# Patient Record
Sex: Male | Born: 1970 | Race: White | Hispanic: No | Marital: Married | Smoking: Never smoker
Health system: Southern US, Community
[De-identification: ages and names within clinical notes are randomized; demographics above are authoritative.]

---

## 1996-07-19 HISTORY — PX: APPENDECTOMY: SHX54

## 2000-04-24 ENCOUNTER — Emergency Department (HOSPITAL_COMMUNITY): Admission: EM | Admit: 2000-04-24 | Discharge: 2000-04-24 | Payer: Self-pay | Admitting: Emergency Medicine

## 2003-12-27 ENCOUNTER — Inpatient Hospital Stay (HOSPITAL_COMMUNITY): Admission: EM | Admit: 2003-12-27 | Discharge: 2003-12-28 | Payer: Self-pay | Admitting: Emergency Medicine

## 2003-12-27 ENCOUNTER — Encounter (INDEPENDENT_AMBULATORY_CARE_PROVIDER_SITE_OTHER): Payer: Self-pay | Admitting: Specialist

## 2009-05-25 ENCOUNTER — Emergency Department (HOSPITAL_BASED_OUTPATIENT_CLINIC_OR_DEPARTMENT_OTHER): Admission: EM | Admit: 2009-05-25 | Discharge: 2009-05-25 | Payer: Self-pay | Admitting: Emergency Medicine

## 2010-12-04 NOTE — Op Note (Signed)
NAMERASHAWD, LASKARIS                         ACCOUNT NO.:  [id number]   MEDICAL RECORD NO.:  [id number]                   PATIENT TYPE:  INP   LOCATION:  0103                                 FACILITY:  [facility]   PHYSICIAN:  Anselm Pancoast. Zachery Dakins, M.D.          DATE OF BIRTH:  [birth year]   DATE OF PROCEDURE:  DATE OF DISCHARGE:                                 OPERATIVE REPORT   PREOPERATIVE DIAGNOSIS:  Acute appendicitis.   POSTOPERATIVE DIAGNOSIS:  Acute appendicitis.   PROCEDURE:  Laparoscopic appendectomy.   ANESTHESIA:  General.   SURGEON:  Anselm Pancoast. Zachery Dakins, M.D.   HISTORY:  Jaime White is a 40 year old male who has had an approximately  36-48 hour history of kind of vague, abdominal, gaseous epigastric pain that  shifted to the lower abdomen this morning.  He was seen in urgent care by  Dr. Willa Frater and found to be definitely tender in the right lower quadrant  and clinically was thought to have acute appendicitis.  The white count was  about 14,000.  He called.  I suggested I see him in the emergency room, and  I was in agreement with the diagnosis.  His urinalysis has been negative,  and I did not think any other tests were necessary.  Patient is quite thin,  and I discussed with him about doing an open or a laparoscopic appendectomy.  He elected for the laparoscopic.   Patient was taken to the operating suite.  He was given 2 gm of Cefotetan,  being allergic to penicillin.  Abdomen was prepped with Betadine surgical  scrub and solution.  A Foley catheter had been inserted sterilely.  The  patient has a little fascial defect right at the umbilicus, and I made an  incision right through the skin within the umbilicus so I could put a few  stitches in this low umbilical defect and close him.  The skin was opened,  and we opened right into that little hernia sac weakness and then placed a  traction suture of 0 Vicryl and the Hasson cannula introduced.  Next, the  camera was inserted.  Carbon dioxide had been infused.  You could see a  markedly inflamed appendix laying, not truly retrocecal but kind of under  the cecum.  The proximal first inch of the appendix was normal.  The rest  was markedly inflamed.  I placed the upper 5 mm trocar and then a 10-11 in  the left lower quadrant.  Then with the camera in the left lower quadrant,  dissected through the umbilical end of the 5 mm port.  I elected to use a  vascular GIA to transect the appendix at the junction with the cecum first,  since this was the only portion of the appendix that was not markedly  inflamed, and I did not want to rupture it by manipulating it.  With the  appendix divided at this point, there was good  hemostasis.  I then placed a  __________ retractor on the base on the appendix and kind of elevated it and  then used the Hasson vascular and divided the mesentery between little  pedicles with hemostasis being obtained.  The appendix was then placed in  the EndoCatch bag.  There was a little inflammation of the periappendiceal  fatty tissue, but I did not remove that.  The little wounds were irrigated  with good hemostasis, and then I switched the camera to the left lower  quadrant and brought out the appendix within the bag at the umbilicus.  Next, I elected to close the fascia at the umbilicus first, using 0  Surgilene, and three stitches were placed and closed it vertically to bring  the low fascia together.  Next, the camera was reinserted.  Looking in,  there was good closure.  No loops of bowel or anything in the area.  Next, a  port in the left lower quadrant was removed, and I did place an 0 Vicryl in  the oblique fascia for closure since he is so thin.  Next, a 5 mm port as  withdrawn.  The carbon dioxide has been released.  The patient's  subcutaneous wounds were closed with 4-0 Vicryl, and then  Benzoin and Steri-Strips on the three little incisions.  The patient  tolerated  the procedure nicely.  Hopefully, he will be ready for discharge  in the morning.  A Foley catheter was removed before awakening the patient.  The patient will go to the recovery room and should be able to tolerate a  diet later on this evening.                                               Anselm Pancoast. Zachery Dakins, M.D.    WJW/MEDQ  D:  12/27/2003  T:  12/27/2003  Job:  [id number]   cc:   [facility]

## 2012-07-03 ENCOUNTER — Emergency Department (HOSPITAL_BASED_OUTPATIENT_CLINIC_OR_DEPARTMENT_OTHER): Payer: Managed Care, Other (non HMO)

## 2012-07-03 ENCOUNTER — Emergency Department (HOSPITAL_BASED_OUTPATIENT_CLINIC_OR_DEPARTMENT_OTHER)
Admission: EM | Admit: 2012-07-03 | Discharge: 2012-07-03 | Disposition: A | Discharge status: Home / Self Care | Payer: Managed Care, Other (non HMO) | Attending: Emergency Medicine | Admitting: Emergency Medicine

## 2012-07-03 ENCOUNTER — Encounter (HOSPITAL_BASED_OUTPATIENT_CLINIC_OR_DEPARTMENT_OTHER): Payer: Self-pay

## 2012-07-03 DIAGNOSIS — R61 Generalized hyperhidrosis: Secondary | ICD-10-CM | POA: Insufficient documentation

## 2012-07-03 DIAGNOSIS — R42 Dizziness and giddiness: Secondary | ICD-10-CM | POA: Insufficient documentation

## 2012-07-03 DIAGNOSIS — R197 Diarrhea, unspecified: Secondary | ICD-10-CM | POA: Insufficient documentation

## 2012-07-03 DIAGNOSIS — R55 Syncope and collapse: Secondary | ICD-10-CM

## 2012-07-03 DIAGNOSIS — R404 Transient alteration of awareness: Secondary | ICD-10-CM | POA: Insufficient documentation

## 2012-07-03 DIAGNOSIS — R112 Nausea with vomiting, unspecified: Secondary | ICD-10-CM | POA: Insufficient documentation

## 2012-07-03 LAB — CBC WITH DIFFERENTIAL/PLATELET
Basophils Absolute: 0 10*3/uL (ref 0.0–0.1)
Eosinophils Absolute: 0.2 10*3/uL (ref 0.0–0.7)
Eosinophils Relative: 1 % (ref 0–5)
Lymphocytes Relative: 26 % (ref 12–46)
Monocytes Absolute: 1.5 10*3/uL — ABNORMAL HIGH (ref 0.1–1.0)
Monocytes Relative: 9 % (ref 3–12)
Neutro Abs: 10.8 10*3/uL — ABNORMAL HIGH (ref 1.7–7.7)
Neutrophils Relative %: 64 % (ref 43–77)
Platelets: 163 10*3/uL (ref 150–400)
RBC: 4.86 MIL/uL (ref 4.22–5.81)

## 2012-07-03 LAB — COMPREHENSIVE METABOLIC PANEL
ALT: 27 U/L (ref 0–53)
GFR calc Af Amer: >90 mL/min (ref >90)
GFR calc non Af Amer: >90 mL/min (ref >90)
Potassium: 3.7 mEq/L (ref 3.5–5.1)
Sodium: 142 mEq/L (ref 135–145)

## 2012-07-03 LAB — URINALYSIS, ROUTINE W REFLEX MICROSCOPIC
Bilirubin Urine: NEGATIVE
Ketones, ur: NEGATIVE mg/dL
Leukocytes, UA: NEGATIVE
Specific Gravity, Urine: 1.019 (ref 1.005–1.030)
pH: 5 (ref 5.0–8.0)

## 2012-07-03 MED ORDER — ONDANSETRON 8 MG PO TBDP: ORAL_TABLET | ORAL | Status: AC

## 2012-07-03 MED ORDER — SODIUM CHLORIDE 0.9 % IV BOLUS (SEPSIS)
1000.0000 mL | Freq: Once | INTRAVENOUS | Status: AC
  Administered 2012-07-03: 1000 mL via INTRAVENOUS

## 2012-07-03 MED ORDER — IOHEXOL 300 MG/ML  SOLN
100.0000 mL | Freq: Once | INTRAMUSCULAR | Status: AC | PRN
  Administered 2012-07-03: 100 mL via INTRAVENOUS

## 2012-07-03 MED ORDER — ONDANSETRON HCL 4 MG/2ML IJ SOLN
INTRAMUSCULAR | Status: AC
  Filled 2012-07-03: qty 2

## 2012-07-03 MED ORDER — SODIUM CHLORIDE 0.9 % IV SOLN: Freq: Once | INTRAVENOUS | Status: DC

## 2012-07-03 MED ORDER — ONDANSETRON HCL 4 MG/2ML IJ SOLN
4.0000 mg | Freq: Once | INTRAMUSCULAR | Status: AC
  Administered 2012-07-03: 4 mg via INTRAVENOUS

## 2012-07-03 NOTE — ED Notes (Signed)
MD back at bedside. Patient reports that he is feeling better. New order for CT abdomen due to spouse reporting that patient was hit in abdomen with soccer ball yesterday during match. No bruising or obvious tenderness noted

## 2012-07-03 NOTE — ED Notes (Signed)
Patient transported to CT 

## 2012-07-03 NOTE — ED Notes (Signed)
I took cbg got reading of 95 mg/dcltr. I also got ecg and gave orig. Copy to Dr. Nicanor Alcon.

## 2012-07-03 NOTE — ED Notes (Signed)
Returned from CT.

## 2012-07-03 NOTE — ED Notes (Signed)
Patient reports that he became nauseated this am and had near syncopal episode. Questionable loc.  Reports dizziness and diaphoresis pta

## 2012-07-03 NOTE — ED Provider Notes (Signed)
History     CSN: [id number]  Arrival date & time 07/03/12  0316   First MD Initiated Contact with Patient 07/03/12 0330      Chief Complaint  Patient presents with  . nausea, not feeling well, diaphoretic     (Consider location/radiation/quality/duration/timing/severity/associated sxs/prior treatment) Patient is a 41 y.o. male presenting with syncope. The history is provided by the patient and the spouse.  Loss of Consciousness This is a new problem. The current episode started less than 1 hour ago. The problem occurs constantly. The problem has been resolved. Pertinent negatives include no chest pain, no abdominal pain, no headaches and no shortness of breath. Nothing aggravates the symptoms. Nothing relieves the symptoms. He has tried nothing for the symptoms. The treatment provided significant relief.    History reviewed. No pertinent past medical history.  History reviewed. No pertinent past surgical history.  No family history on file.  History  Substance Use Topics  . Smoking status: Never Smoker   . Smokeless tobacco: Not on file  . Alcohol Use:       Review of Systems  Constitutional: Negative for fever.  Respiratory: Negative for chest tightness and shortness of breath.   Cardiovascular: Positive for syncope. Negative for chest pain.  Gastrointestinal: Positive for vomiting. Negative for abdominal pain.  Neurological: Positive for light-headedness. Negative for syncope, facial asymmetry, speech difficulty, weakness, numbness and headaches.  All other systems reviewed and are negative.    Allergies  Penicillins  Home Medications  No current outpatient prescriptions on file.  BP 106/67  Pulse 73  Temp 97.7 F (36.5 C) (Oral)  Resp 16  SpO2 100%  Physical Exam  Constitutional: He is oriented to person, place, and time. He appears well-developed and well-nourished. No distress.  HENT:  Head: Normocephalic and atraumatic.  Right Ear: No mastoid  tenderness. Tympanic membrane is not injected. No hemotympanum.  Left Ear: No mastoid tenderness. Tympanic membrane is not injected. No hemotympanum.  Eyes: Conjunctivae normal and EOM are normal. Pupils are equal, round, and reactive to light.  Neck: Normal range of motion. Neck supple.  Cardiovascular: Normal rate, regular rhythm and intact distal pulses.   Pulmonary/Chest: Effort normal and breath sounds normal. He has no wheezes. He has no rales.  Abdominal: Soft. Bowel sounds are normal. There is no tenderness. There is no rebound and no guarding.  Musculoskeletal: Normal range of motion. He exhibits no edema.  Neurological: He is alert and oriented to person, place, and time. He has normal reflexes. No cranial nerve deficit.  Skin: Skin is warm and dry. No rash noted.  Psychiatric: He has a normal mood and affect.    ED Course  Procedures (including critical care time)  Labs Reviewed  CBC WITH DIFFERENTIAL - Abnormal; Notable for the following:    WBC 16.8 (*)     MCHC 36.1 (*)     Neutro Abs 10.8 (*)     Lymphs Abs 4.4 (*)     Monocytes Absolute 1.5 (*)     All other components within normal limits  COMPREHENSIVE METABOLIC PANEL - Abnormal; Notable for the following:    Glucose, Bld 105 (*)     AST 41 (*)     All other components within normal limits  GLUCOSE, CAPILLARY  TROPONIN I  URINALYSIS, ROUTINE W REFLEX MICROSCOPIC   Dg Chest Portable 1 View  07/03/2012  *RADIOLOGY REPORT*  Clinical Data: Near-syncopal episode  PORTABLE CHEST - 1 VIEW  Comparison: 12/27/2003  Findings:  Lungs are clear. No pleural effusion or pneumothorax. The cardiomediastinal contours are within normal limits. The visualized bones and soft tissues are without significant appreciable abnormality.  IMPRESSION: No radiographic evidence of acute cardiopulmonary process.   Original Report Authenticated By: Jearld Lesch, M.D.      No diagnosis found.    MDM   Date: 07/03/2012  Rate: 76   Rhythm: normal sinus rhythm  QRS Axis: normal  Intervals: normal  ST/T Wave abnormalities: normal  Conduction Disutrbances: none  Narrative Interpretation: unremarkable     Case d/w Dr. Felipa Eth and patient will be called with close follow up  Shortly thereafter wife reports child in the home began vomiting, symptoms likely to GI illness.        Jasmine Awe, MD 07/03/12 [phone]

## 2012-07-03 NOTE — ED Notes (Signed)
Sprite given as PO challenge, reports nausea is relieved

## 2012-07-03 NOTE — ED Notes (Signed)
I drew blood (full rainbow), including lactic acid on ice, labeled and took to lab, I then completed orthostatic vitals and notified Doctor Palumbo of results.

## 2012-07-03 NOTE — ED Notes (Signed)
MD at bedside. 

## 2012-07-03 NOTE — ED Notes (Signed)
Family at bedside. Patient reports that he is feeling better. IV NS infusing, awaiting lab and CT results.

## 2017-02-22 ENCOUNTER — Other Ambulatory Visit: Payer: Self-pay | Admitting: General Surgery

## 2017-09-11 ENCOUNTER — Encounter (HOSPITAL_BASED_OUTPATIENT_CLINIC_OR_DEPARTMENT_OTHER): Payer: Self-pay | Admitting: *Deleted

## 2017-09-11 ENCOUNTER — Emergency Department (HOSPITAL_BASED_OUTPATIENT_CLINIC_OR_DEPARTMENT_OTHER): Payer: 59

## 2017-09-11 ENCOUNTER — Other Ambulatory Visit: Payer: Self-pay

## 2017-09-11 DIAGNOSIS — Z79899 Other long term (current) drug therapy: Secondary | ICD-10-CM | POA: Insufficient documentation

## 2017-09-11 DIAGNOSIS — Y9366 Activity, soccer: Secondary | ICD-10-CM | POA: Insufficient documentation

## 2017-09-11 DIAGNOSIS — Y929 Unspecified place or not applicable: Secondary | ICD-10-CM | POA: Insufficient documentation

## 2017-09-11 DIAGNOSIS — W010XXA Fall on same level from slipping, tripping and stumbling without subsequent striking against object, initial encounter: Secondary | ICD-10-CM | POA: Insufficient documentation

## 2017-09-11 DIAGNOSIS — Y999 Unspecified external cause status: Secondary | ICD-10-CM | POA: Diagnosis not present

## 2017-09-11 DIAGNOSIS — S52552A Other extraarticular fracture of lower end of left radius, initial encounter for closed fracture: Secondary | ICD-10-CM | POA: Diagnosis not present

## 2017-09-11 DIAGNOSIS — S59912A Unspecified injury of left forearm, initial encounter: Secondary | ICD-10-CM | POA: Diagnosis present

## 2017-09-11 NOTE — ED Triage Notes (Signed)
Pt reports playing soccer and fell on left arm. Swelling noted. Pulse present. Able to wiggle digits

## 2017-09-11 NOTE — ED Triage Notes (Signed)
Called x1 for triage, no answer.

## 2017-09-11 NOTE — ED Notes (Signed)
Registration states pt has returned to lobby. He was at vending machine

## 2017-09-11 NOTE — ED Notes (Signed)
Second call no answer

## 2017-09-12 ENCOUNTER — Emergency Department (HOSPITAL_BASED_OUTPATIENT_CLINIC_OR_DEPARTMENT_OTHER)
Admission: EM | Admit: 2017-09-12 | Discharge: 2017-09-12 | Disposition: A | Discharge status: Home / Self Care | Payer: 59 | Attending: Emergency Medicine | Admitting: Emergency Medicine

## 2017-09-12 DIAGNOSIS — S52552A Other extraarticular fracture of lower end of left radius, initial encounter for closed fracture: Secondary | ICD-10-CM

## 2017-09-12 MED ORDER — HYDROCODONE-ACETAMINOPHEN 5-325 MG PO TABS: 1.0000 | ORAL_TABLET | Freq: Four times a day (QID) | ORAL | 0 refills | Status: DC | PRN

## 2017-09-12 MED ORDER — OXYCODONE-ACETAMINOPHEN 5-325 MG PO TABS: 1.0000 | ORAL_TABLET | Freq: Once | ORAL | Status: DC

## 2017-09-12 NOTE — ED Notes (Signed)
Pt does not want to take percocet at this time. EMT at bedside to place splint.

## 2017-09-12 NOTE — ED Notes (Signed)
PMS intact before and after. Pt tolerated well. All questions answered. 

## 2017-09-12 NOTE — ED Provider Notes (Signed)
[facility] EMERGENCY DEPARTMENT Provider Note   CSN: [id number] Arrival date & time: 09/11/17  2145     History   Chief Complaint Chief Complaint  Patient presents with  . Arm Injury    HPI Jaime White is a 47 y.o. male.  HPI  This a 47 year old male who presents with left wrist injury.  Patient reports that he fell playing soccer.  He fell out on outstretched hand.  He is right-handed.  Patient reports pain only with mobilization of the wrist.  Without mobilization his pain is 0.  He has not taken anything for his symptoms.  Denies any other injury.  Denies numbness or tingling in the hand.  History reviewed. No pertinent past medical history.  There are no active problems to display for this patient.   History reviewed. No pertinent surgical history.     Home Medications    Prior to Admission medications   Medication Sig Start Date End Date Taking? Authorizing Provider  HYDROcodone-acetaminophen (NORCO/VICODIN) 5-325 MG tablet Take 1 tablet by mouth every 6 (six) hours as needed. 09/12/17   Shon Baton, MD  ondansetron (ZOFRAN ODT) 8 MG disintegrating tablet 8mg  ODT q8 hours prn nausea 07/03/12   Palumbo, April, MD    Family History No family history on file.  Social History Social History   Tobacco Use  . Smoking status: Never Smoker  . Smokeless tobacco: Never Used  Substance Use Topics  . Alcohol use: Yes  . Drug use: No     Allergies   Penicillins   Review of Systems Review of Systems  Musculoskeletal:       Left wrist pain  Skin: Negative for wound.  Neurological: Negative for weakness and numbness.  All other systems reviewed and are negative.    Physical Exam Updated Vital Signs BP 106/75 (BP Location: Left Arm)   Pulse 63   Temp 98.2 F (36.8 C) (Oral)   Resp 16   Ht 5\' 10"  (1.778 m)   Wt 74.8 kg (165 lb)   SpO2 100%   BMI 23.68 kg/m   Physical Exam  Constitutional: He is oriented to person, place, and  time. He appears well-developed and well-nourished. No distress.  HENT:  Head: Normocephalic and atraumatic.  Cardiovascular: Normal rate and regular rhythm.  Pulmonary/Chest: Effort normal. No respiratory distress.  Musculoskeletal:  Focused examination of the left wrist reveals mild swelling with tenderness palpation over the distal radius, no significant deformity noted, 2+ radial pulse, neurovascular intact distally  Neurological: He is alert and oriented to person, place, and time.  Skin: Skin is warm and dry.  Psychiatric: He has a normal mood and affect.  Nursing note and vitals reviewed.    ED Treatments / Results  Labs (all labs ordered are listed, but only abnormal results are displayed) Labs Reviewed - No data to display  EKG  EKG Interpretation None       Radiology Dg Wrist Complete Left  Result Date: 09/11/2017 CLINICAL DATA:  Soccer injury, left wrist pain. EXAM: LEFT WRIST - COMPLETE 3+ VIEW COMPARISON:  None. FINDINGS: There is a nondisplaced fracture through the distal left radius with intra-articular extension. No ulnar abnormality. No subluxation or dislocation. IMPRESSION: Nondisplaced distal left radial fracture. Electronically Signed   By: Charlett Nose M.D.   On: 09/11/2017 23:26    Procedures Procedures (including critical care time)  Medications Ordered in ED Medications  oxyCODONE-acetaminophen (PERCOCET/ROXICET) 5-325 MG per tablet 1 tablet (not administered)  Initial Impression / Assessment and Plan / ED Course  I have reviewed the triage vital signs and the nursing notes.  Pertinent labs & imaging results that were available during my care of the patient were reviewed by me and considered in my medical decision making (see chart for details).     Patient presents with left wrist injury.  X-rays show a nondisplaced distal radius fracture.  Patient is neurovascularly intact.  Patient was placed in a sugar tong splint.  He was given  follow-up with hand surgery.  Maintain immobilization until follow-up.  After history, exam, and medical workup I feel the patient has been appropriately medically screened and is safe for discharge home. Pertinent diagnoses were discussed with the patient. Patient was given return precautions.   Final Clinical Impressions(s) / ED Diagnoses   Final diagnoses:  Other closed extra-articular fracture of distal end of left radius, initial encounter    ED Discharge Orders        Ordered    HYDROcodone-acetaminophen (NORCO/VICODIN) 5-325 MG tablet  Every 6 hours PRN     09/12/17 0122       Shon BatonHorton, Courtney F, MD 09/12/17 [phone]

## 2017-09-12 NOTE — ED Notes (Signed)
Pt given d/c instructions as per chart. Rx x 1 with precautions. Verbalizes understanding. No questions. 

## 2017-09-12 NOTE — Discharge Instructions (Signed)
You were seen today for an injury of the left wrist.  You have a nondisplaced fracture.  This will be splinted.  Keep it elevated.  Follow-up with orthopedics for definitive management.

## 2017-09-12 NOTE — ED Notes (Signed)
Pt was playing soccer earlier today and fell on left wrist. Presents with slight deformity and swelling to area. Moves fingers. Feels touch. Cap refill < 3 sec. Ice applied.

## 2019-07-21 IMAGING — DX DG WRIST COMPLETE 3+V*L*
4 series · 4 of 4 positions shown · non-contrast
Comparison: None.

CLINICAL DATA: Soccer injury, left wrist pain.

EXAM:
LEFT WRIST - COMPLETE 3+ VIEW

[wrist pa]
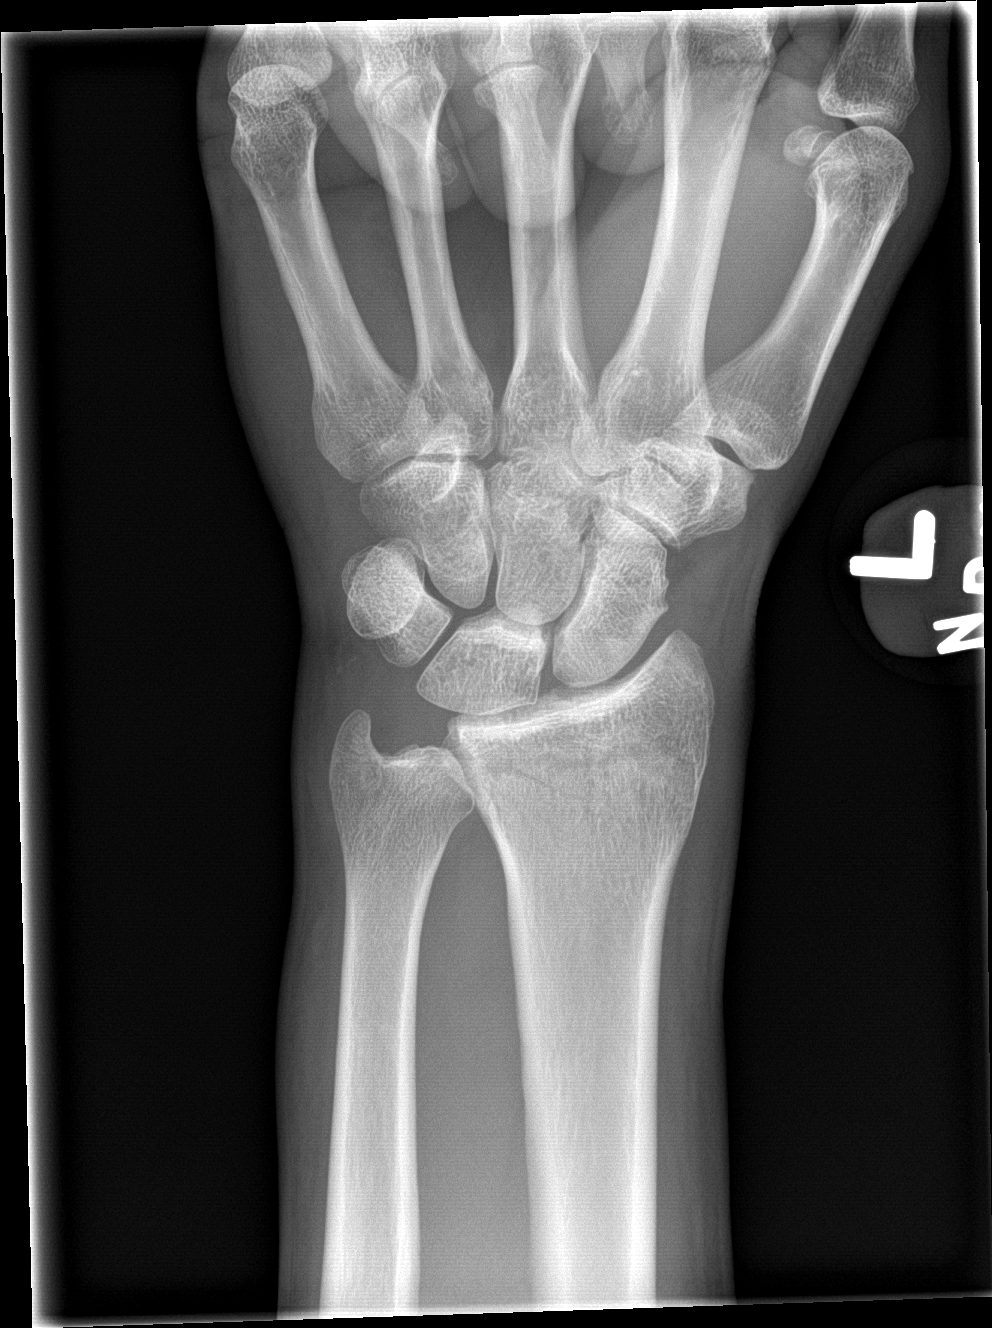

[wrist obl]
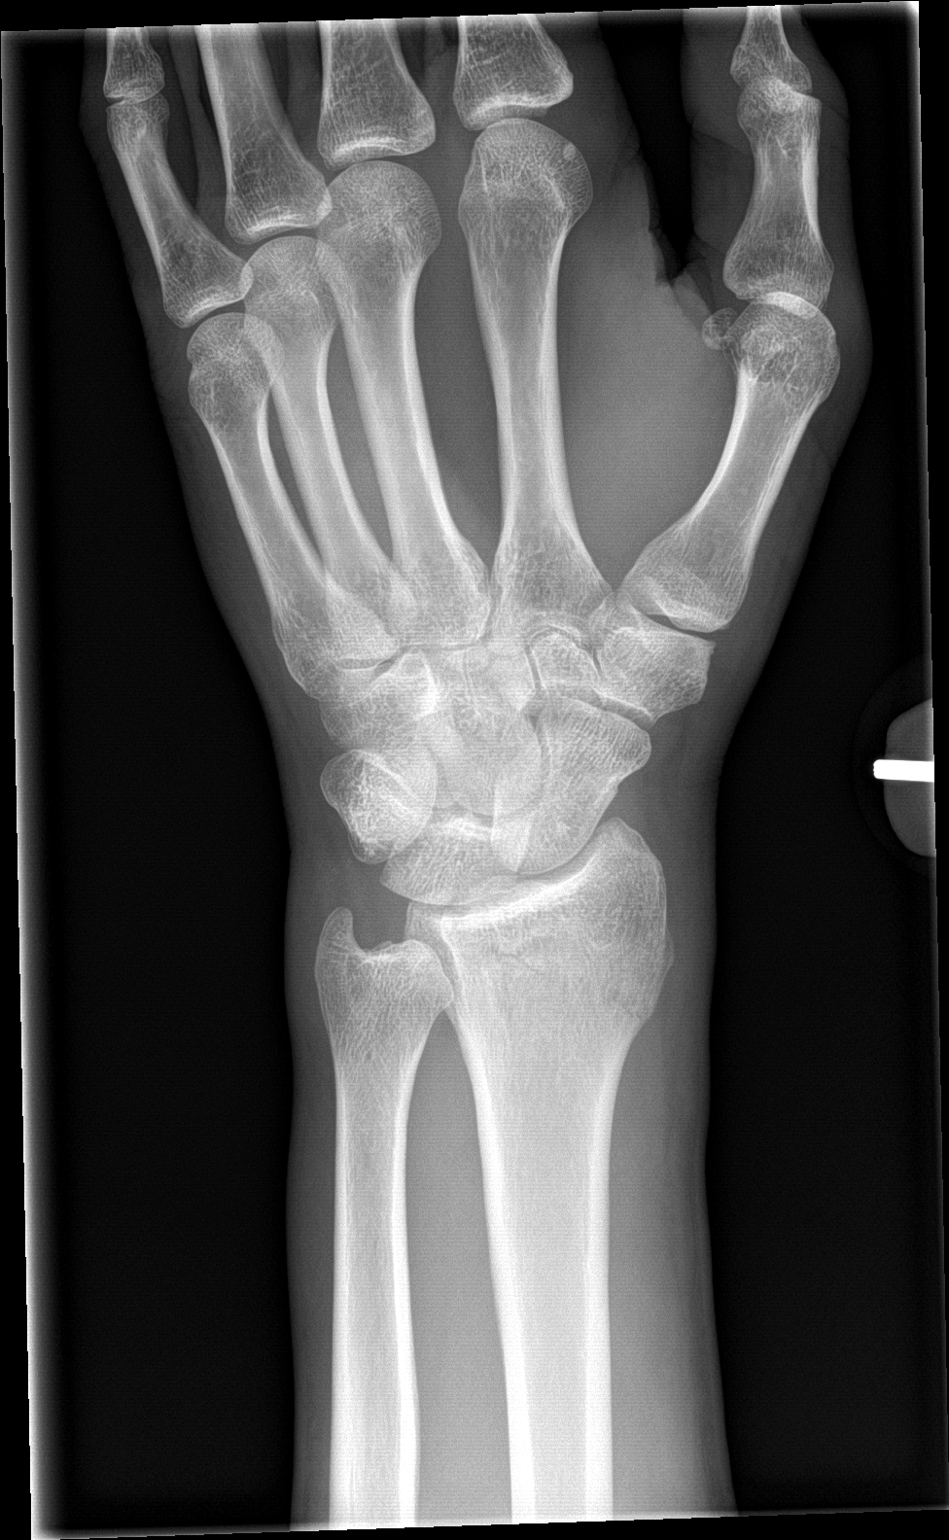

[wrist lat]
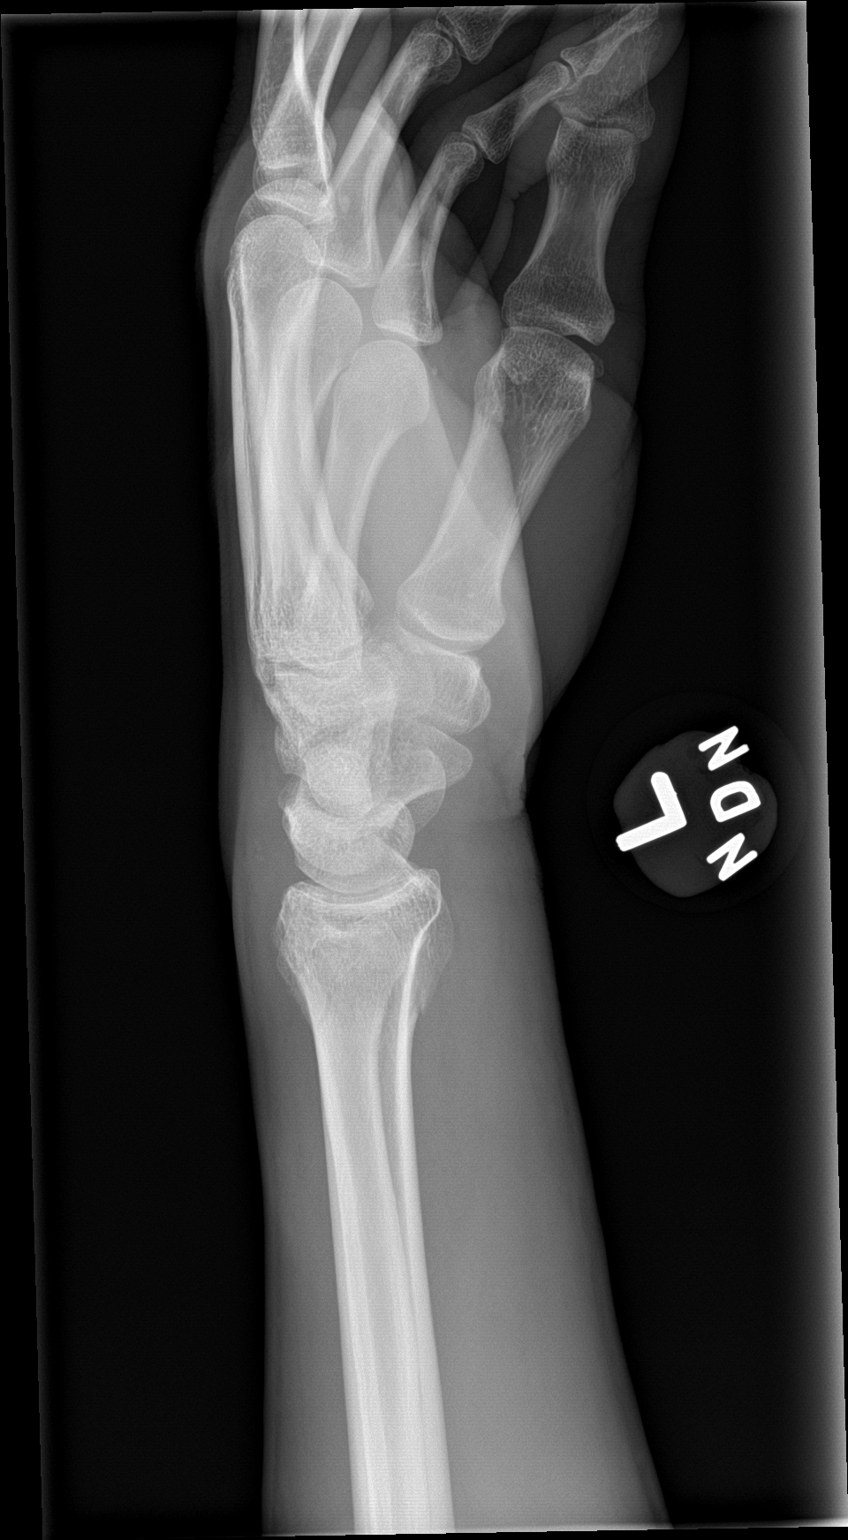

[wrist navicular]
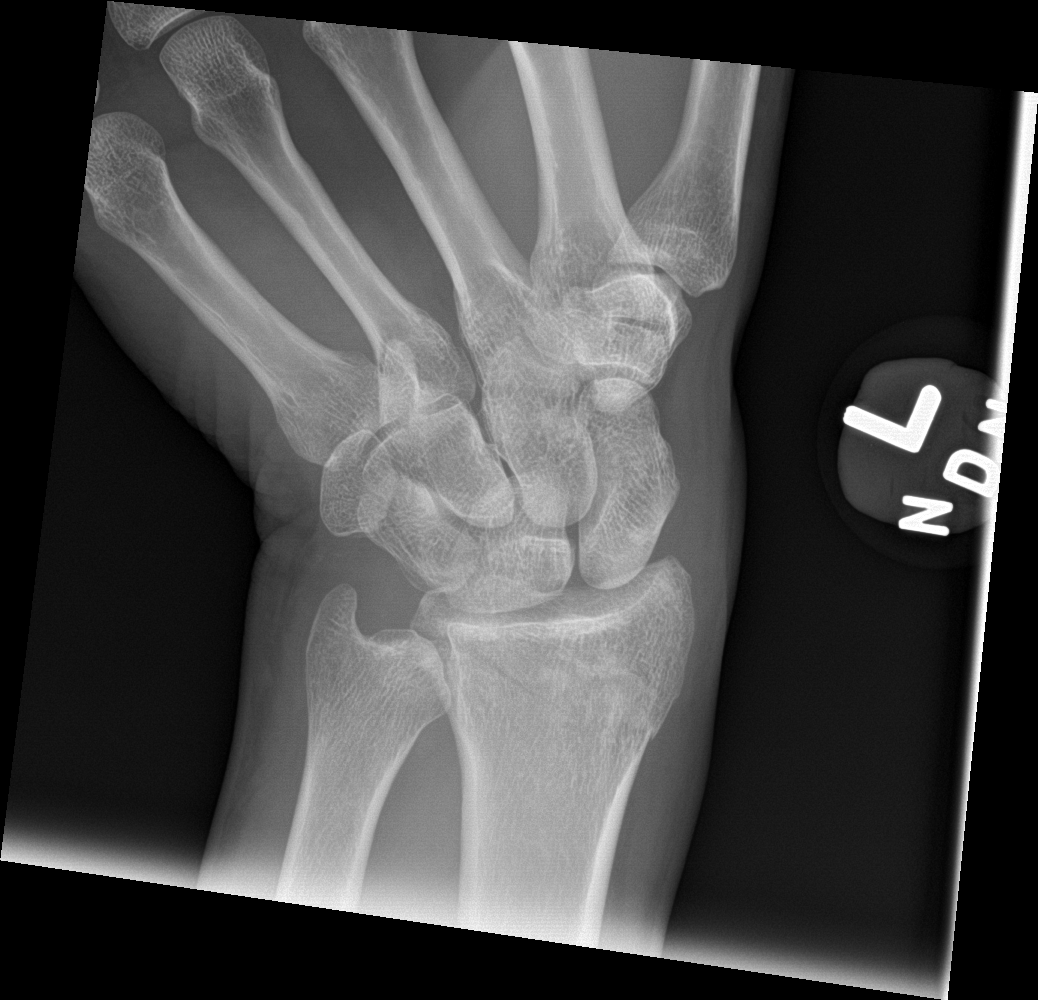

[4 of 4 positions shown; findings below may reference images not displayed]

FINDINGS: There is a nondisplaced fracture through the distal left radius with
intra-articular extension. No ulnar abnormality. No subluxation or
dislocation.
IMPRESSION: Nondisplaced distal left radial fracture.

## 2019-10-11 ENCOUNTER — Ambulatory Visit: Payer: Self-pay | Attending: Internal Medicine

## 2019-10-11 DIAGNOSIS — Z23 Encounter for immunization: Secondary | ICD-10-CM

## 2019-10-11 NOTE — Progress Notes (Signed)
   Covid-19 Vaccination Clinic  Name:  Tarris Delbene    MRN: [id number] DOB: [birth year]  10/11/2019  Mr. Camarena was observed post Covid-19 immunization for 15 minutes without incident. He was provided with Vaccine Information Sheet and instruction to access the V-Safe system.   Mr. Vangieson was instructed to call 911 with any severe reactions post vaccine: Marland Kitchen Difficulty breathing  . Swelling of face and throat  . A fast heartbeat  . A bad rash all over body  . Dizziness and weakness   Immunizations Administered    Name Date Dose VIS Date Route   Pfizer COVID-19 Vaccine 10/11/2019  8:58 AM 0.3 mL 06/29/2019 Intramuscular   Manufacturer: [employer], [employer]   Lot: [id number]   NDC: [address]-4

## 2019-11-05 ENCOUNTER — Ambulatory Visit: Payer: Self-pay | Attending: Internal Medicine

## 2019-11-05 DIAGNOSIS — Z23 Encounter for immunization: Secondary | ICD-10-CM

## 2019-11-05 NOTE — Progress Notes (Signed)
   Covid-19 Vaccination Clinic  Name:  Demere Dotzler    MRN: [id number] DOB: October 23, 1970  11/05/2019  Mr. Colgate was observed post Covid-19 immunization for 15 minutes without incident. He was provided with Vaccine Information Sheet and instruction to access the V-Safe system.   Mr. Macon was instructed to call 911 with any severe reactions post vaccine: Marland Kitchen Difficulty breathing  . Swelling of face and throat  . A fast heartbeat  . A bad rash all over body  . Dizziness and weakness   Immunizations Administered    Name Date Dose VIS Date Route   Pfizer COVID-19 Vaccine 11/05/2019  8:37 AM 0.3 mL 09/12/2018 Intramuscular   Manufacturer: [employer], [employer]   Lot: [id number]   NDC: [address]-6

## 2020-07-28 ENCOUNTER — Ambulatory Visit: Payer: Self-pay

## 2020-08-05 ENCOUNTER — Ambulatory Visit: Payer: Self-pay | Attending: Internal Medicine

## 2020-08-05 ENCOUNTER — Other Ambulatory Visit (HOSPITAL_BASED_OUTPATIENT_CLINIC_OR_DEPARTMENT_OTHER): Payer: Self-pay | Admitting: Internal Medicine

## 2020-08-05 DIAGNOSIS — Z23 Encounter for immunization: Secondary | ICD-10-CM

## 2020-08-05 MED FILL — PFIZER-BIONTECH COVID-19 VA: 30 | 21 days supply | Qty: 0 | Fill #0

## 2020-08-05 NOTE — Progress Notes (Signed)
   Covid-19 Vaccination Clinic  Name:  Jaime White    MRN: [id number] DOB: Oct 10, 1970  08/05/2020  Jaime White was observed post Covid-19 immunization for 15 minutes without incident. He was provided with Vaccine Information Sheet and instruction to access the V-Safe system.   Vaccinated By: Arma Heading  Jaime White was instructed to call 911 with any severe reactions post vaccine: Marland Kitchen Difficulty breathing  . Swelling of face and throat  . A fast heartbeat  . A bad rash all over body  . Dizziness and weakness   Immunizations Administered    Name Date Dose VIS Date Route   Pfizer COVID-19 Vaccine 08/05/2020 10:13 AM 0.3 mL 05/07/2020 Intramuscular   Manufacturer: [employer], [employer]   Lot: [id number]   NDC: [address]-5

## 2020-08-06 ENCOUNTER — Ambulatory Visit: Payer: Self-pay

## 2022-09-27 DIAGNOSIS — R7301 Impaired fasting glucose: Secondary | ICD-10-CM | POA: Diagnosis not present

## 2022-10-04 DIAGNOSIS — Z0000 Encounter for general adult medical examination without abnormal findings: Secondary | ICD-10-CM | POA: Diagnosis not present

## 2022-10-04 DIAGNOSIS — Z79899 Other long term (current) drug therapy: Secondary | ICD-10-CM | POA: Diagnosis not present

## 2022-10-04 DIAGNOSIS — Z1331 Encounter for screening for depression: Secondary | ICD-10-CM | POA: Diagnosis not present

## 2022-10-04 DIAGNOSIS — Z125 Encounter for screening for malignant neoplasm of prostate: Secondary | ICD-10-CM | POA: Diagnosis not present

## 2022-10-04 DIAGNOSIS — Z1339 Encounter for screening examination for other mental health and behavioral disorders: Secondary | ICD-10-CM | POA: Diagnosis not present

## 2022-10-04 DIAGNOSIS — R82998 Other abnormal findings in urine: Secondary | ICD-10-CM | POA: Diagnosis not present

## 2023-05-21 DIAGNOSIS — H00031 Abscess of right upper eyelid: Secondary | ICD-10-CM | POA: Diagnosis not present

## 2023-05-21 DIAGNOSIS — H00032 Abscess of right lower eyelid: Secondary | ICD-10-CM | POA: Diagnosis not present

## 2023-06-07 ENCOUNTER — Other Ambulatory Visit (HOSPITAL_BASED_OUTPATIENT_CLINIC_OR_DEPARTMENT_OTHER): Payer: Self-pay

## 2023-09-05 ENCOUNTER — Other Ambulatory Visit (HOSPITAL_BASED_OUTPATIENT_CLINIC_OR_DEPARTMENT_OTHER): Payer: Self-pay

## 2023-09-05 MED ORDER — VALACYCLOVIR HCL 1 G PO TABS
2000.0000 mg | ORAL_TABLET | Freq: Two times a day (BID) | ORAL | 1 refills | Status: AC | PRN
  Filled 2023-09-05: qty 30, 8d supply, fill #0

## 2023-10-13 DIAGNOSIS — R7301 Impaired fasting glucose: Secondary | ICD-10-CM | POA: Diagnosis not present

## 2023-10-13 DIAGNOSIS — Z125 Encounter for screening for malignant neoplasm of prostate: Secondary | ICD-10-CM | POA: Diagnosis not present

## 2023-10-13 DIAGNOSIS — R7989 Other specified abnormal findings of blood chemistry: Secondary | ICD-10-CM | POA: Diagnosis not present

## 2023-10-20 DIAGNOSIS — Z23 Encounter for immunization: Secondary | ICD-10-CM | POA: Diagnosis not present

## 2023-10-20 DIAGNOSIS — Z1331 Encounter for screening for depression: Secondary | ICD-10-CM | POA: Diagnosis not present

## 2023-10-20 DIAGNOSIS — Z0000 Encounter for general adult medical examination without abnormal findings: Secondary | ICD-10-CM | POA: Diagnosis not present

## 2023-10-20 DIAGNOSIS — Z1339 Encounter for screening examination for other mental health and behavioral disorders: Secondary | ICD-10-CM | POA: Diagnosis not present

## 2023-12-30 ENCOUNTER — Encounter: Payer: Self-pay | Admitting: Internal Medicine

## 2024-01-31 ENCOUNTER — Encounter: Payer: Self-pay | Admitting: Internal Medicine

## 2024-01-31 ENCOUNTER — Ambulatory Visit (AMBULATORY_SURGERY_CENTER): Payer: Self-pay

## 2024-01-31 VITALS — Ht 70.0 in | Wt 160.0 lb

## 2024-01-31 DIAGNOSIS — Z1211 Encounter for screening for malignant neoplasm of colon: Secondary | ICD-10-CM

## 2024-01-31 MED ORDER — NA SULFATE-K SULFATE-MG SULF 17.5-3.13-1.6 GM/177ML PO SOLN: 1.0000 | Freq: Once | ORAL | 0 refills | Status: AC

## 2024-01-31 NOTE — Progress Notes (Signed)

## 2024-02-17 ENCOUNTER — Encounter: Payer: Self-pay | Admitting: Internal Medicine

## 2024-02-17 ENCOUNTER — Ambulatory Visit: Payer: Self-pay | Admitting: Internal Medicine

## 2024-02-17 VITALS — BP 102/56 | HR 46 | Temp 98.1°F | Resp 8 | Ht 70.0 in | Wt 160.0 lb

## 2024-02-17 DIAGNOSIS — K6389 Other specified diseases of intestine: Secondary | ICD-10-CM | POA: Diagnosis not present

## 2024-02-17 DIAGNOSIS — K648 Other hemorrhoids: Secondary | ICD-10-CM | POA: Diagnosis not present

## 2024-02-17 DIAGNOSIS — Z1211 Encounter for screening for malignant neoplasm of colon: Secondary | ICD-10-CM | POA: Diagnosis not present

## 2024-02-17 DIAGNOSIS — K635 Polyp of colon: Secondary | ICD-10-CM

## 2024-02-17 DIAGNOSIS — D120 Benign neoplasm of cecum: Secondary | ICD-10-CM

## 2024-02-17 DIAGNOSIS — D127 Benign neoplasm of rectosigmoid junction: Secondary | ICD-10-CM

## 2024-02-17 DIAGNOSIS — D125 Benign neoplasm of sigmoid colon: Secondary | ICD-10-CM

## 2024-02-17 DIAGNOSIS — K5730 Diverticulosis of large intestine without perforation or abscess without bleeding: Secondary | ICD-10-CM | POA: Diagnosis not present

## 2024-02-17 MED ORDER — SODIUM CHLORIDE 0.9 % IV SOLN: 500.0000 mL | Freq: Once | INTRAVENOUS | Status: DC

## 2024-02-17 NOTE — Addendum Note (Signed)
 Addended by: CAMILLE SHARLET RAMAN on: 02/17/2024 09:38 AM   Modules accepted: Orders

## 2024-02-17 NOTE — Progress Notes (Signed)
 HISTORY OF PRESENT ILLNESS:  Jaime White is a 53 y.o. male who presents today for routine screening colonoscopy.  No complaints  REVIEW OF SYSTEMS:  All non-GI ROS negative except for  History reviewed. No pertinent past medical history.  Past Surgical History:  Procedure Laterality Date   APPENDECTOMY  1998    Social History Jaime White  reports that he has never smoked. He has never used smokeless tobacco. He reports current alcohol  use. He reports that he does not use drugs.  family history is not on file.  Allergies  Allergen Reactions   Penicillins Rash       PHYSICAL EXAMINATION: Vital signs: BP 116/73   Pulse (!) 49   Temp 98.1 F (36.7 C) (Skin)   Resp 10   Ht 5' 10 (1.778 m)   Wt 160 lb (72.6 kg)   SpO2 100%   BMI 22.96 kg/m  General: Well-developed, well-nourished, no acute distress HEENT: Sclerae are anicteric, conjunctiva pink. Oral mucosa intact Lungs: Clear Heart: Regular Abdomen: soft, nontender, nondistended, no obvious ascites, no peritoneal signs, normal bowel sounds. No organomegaly. Extremities: No edema Psychiatric: alert and oriented x3. Cooperative     ASSESSMENT:  Colon cancer screening   PLAN:   Screening colonoscopy

## 2024-02-17 NOTE — Progress Notes (Signed)
 Pt sedate, gd SR's, VSS, report to RN

## 2024-02-17 NOTE — Progress Notes (Signed)
 Pt's states no medical or surgical changes since previsit or office visit.

## 2024-02-17 NOTE — Progress Notes (Signed)
 Called to room to assist during endoscopic procedure.  Patient ID and intended procedure confirmed with present staff. Received instructions for my participation in the procedure from the performing physician.

## 2024-02-17 NOTE — Op Note (Signed)
 [facility] Patient Name: Jaime White Procedure Date: 02/17/2024 8:07 AM MRN: [id number] Endoscopist: Norleen SAILOR. Abran , MD, [id number] Age: 53 Referring MD:  Date of Birth: Feb 07, 1971 Gender: Male Account #: [id number] Procedure:                Colonoscopy with cold snare polypectomy x 2; biopsy                            polypectomy x 1 Indications:              Screening for colorectal malignant neoplasm Medicines:                Monitored Anesthesia Care Procedure:                Pre-Anesthesia Assessment:                           - Prior to the procedure, a History and Physical                            was performed, and patient medications and                            allergies were reviewed. The patient's tolerance of                            previous anesthesia was also reviewed. The risks                            and benefits of the procedure and the sedation                            options and risks were discussed with the patient.                            All questions were answered, and informed consent                            was obtained. Prior Anticoagulants: The patient has                            taken no anticoagulant or antiplatelet agents. ASA                            Grade Assessment: I - A normal, healthy patient.                            After reviewing the risks and benefits, the patient                            was deemed in satisfactory condition to undergo the                            procedure.  After obtaining informed consent, the colonoscope                            was passed under direct vision. Throughout the                            procedure, the patient's blood pressure, pulse, and                            oxygen saturations were monitored continuously. The                            Olympus Scope [id number] was introduced through the                            anus and advanced  to the the cecum, identified by                            appendiceal orifice and ileocecal valve. The                            ileocecal valve, appendiceal orifice, and rectum                            were photographed. The quality of the bowel                            preparation was excellent. The colonoscopy was                            performed without difficulty. The patient tolerated                            the procedure well. The bowel preparation used was                            SUPREP via split dose instruction. Scope In: 8:19:50 AM Scope Out: 8:33:51 AM Scope Withdrawal Time: 0 hours 11 minutes 26 seconds  Total Procedure Duration: 0 hours 14 minutes 1 second  Findings:                 Two polyps were found in the recto-sigmoid colon                            and cecum. The polyps were 3 to 5 mm in size. These                            polyps were removed with a cold snare. Resection                            and retrieval were complete.                           A 1 mm polyp was  found in the sigmoid colon. The                            polyp was removed with a jumbo cold forceps.                            Resection and retrieval were complete.                           Multiple small-mouthed diverticula were found in                            the sigmoid colon.                           Internal hemorrhoids were found during retroflexion.                           The exam was otherwise without abnormality on                            direct and retroflexion views. Complications:            No immediate complications. Estimated blood loss:                            None. Estimated Blood Loss:     Estimated blood loss: none. Impression:               - Two 3 to 5 mm polyps at the recto-sigmoid colon                            and in the cecum, removed with a cold snare.                            Resected and retrieved.                           - One  1 mm polyp in the sigmoid colon, removed with                            a jumbo cold forceps. Resected and retrieved.                           - Diverticulosis in the sigmoid colon.                           - Internal hemorrhoids.                           - The examination was otherwise normal on direct                            and retroflexion views. Recommendation:           - Repeat colonoscopy in 5-10 years for surveillance.                           -  Patient has a contact number available for                            emergencies. The signs and symptoms of potential                            delayed complications were discussed with the                            patient. Return to normal activities tomorrow.                            Written discharge instructions were provided to the                            patient.                           - Resume previous diet.                           - Continue present medications.                           - Await pathology results. Norleen SAILOR. Abran, MD 02/17/2024 9:00:30 AM This report has been signed electronically.

## 2024-02-17 NOTE — Patient Instructions (Addendum)
 Handouts Provided:  Polyps and Diverticulosis  YOU HAD AN ENDOSCOPIC PROCEDURE TODAY AT THE [facility]:   Refer to the procedure report that was given to you for any specific questions about what was found during the examination.  If the procedure report does not answer your questions, please call your gastroenterologist to clarify.  If you requested that your care partner not be given the details of your procedure findings, then the procedure report has been included in a sealed envelope for you to review at your convenience later.  YOU SHOULD EXPECT: Some feelings of bloating in the abdomen. Passage of more gas than usual.  Walking can help get rid of the air that was put into your GI tract during the procedure and reduce the bloating. If you had a lower endoscopy (such as a colonoscopy or flexible sigmoidoscopy) you may notice spotting of blood in your stool or on the toilet paper. If you underwent a bowel prep for your procedure, you may not have a normal bowel movement for a few days.  Please Note:  You might notice some irritation and congestion in your nose or some drainage.  This is from the oxygen used during your procedure.  There is no need for concern and it should clear up in a day or so.  SYMPTOMS TO REPORT IMMEDIATELY:  Following lower endoscopy (colonoscopy or flexible sigmoidoscopy):  Excessive amounts of blood in the stool  Significant tenderness or worsening of abdominal pains  Swelling of the abdomen that is new, acute  Fever of 100F or higher  Following upper endoscopy (EGD)  Vomiting of blood or coffee ground material  New chest pain or pain under the shoulder blades  Painful or persistently difficult swallowing  New shortness of breath  Fever of 100F or higher  Black, tarry-looking stools  For urgent or emergent issues, a gastroenterologist can be reached at any hour by calling [phone]. Do not use MyChart messaging for urgent concerns.     DIET:  We do recommend a small meal at first, but then you may proceed to your regular diet.  Drink plenty of fluids but you should avoid alcoholic beverages for 24 hours.  ACTIVITY:  You should plan to take it easy for the rest of today and you should NOT DRIVE or use heavy machinery until tomorrow (because of the sedation medicines used during the test).    FOLLOW UP: Our staff will call the number listed on your records the next business day following your procedure.  We will call around 7:15- 8:00 am to check on you and address any questions or concerns that you may have regarding the information given to you following your procedure. If we do not reach you, we will leave a message.     If any biopsies were taken you will be contacted by phone or by letter within the next 1-3 weeks.  Please call us  at [phone] if you have not heard about the biopsies in 3 weeks.    SIGNATURES/CONFIDENTIALITY: You and/or your care partner have signed paperwork which will be entered into your electronic medical record.  These signatures attest to the fact that that the information above on your After Visit Summary has been reviewed and is understood.  Full responsibility of the confidentiality of this discharge information lies with you and/or your care-partner.

## 2024-02-20 ENCOUNTER — Telehealth: Payer: Self-pay | Admitting: Lactation Services

## 2024-02-20 NOTE — Telephone Encounter (Signed)
 No answer left voice mail

## 2024-02-22 ENCOUNTER — Ambulatory Visit: Payer: Self-pay | Admitting: Internal Medicine

## 2024-02-22 LAB — SURGICAL PATHOLOGY

## 2024-03-08 ENCOUNTER — Other Ambulatory Visit: Payer: Self-pay

## 2024-03-08 ENCOUNTER — Encounter (HOSPITAL_BASED_OUTPATIENT_CLINIC_OR_DEPARTMENT_OTHER): Payer: Self-pay | Admitting: Emergency Medicine

## 2024-03-08 ENCOUNTER — Emergency Department (HOSPITAL_BASED_OUTPATIENT_CLINIC_OR_DEPARTMENT_OTHER)

## 2024-03-08 ENCOUNTER — Emergency Department (HOSPITAL_BASED_OUTPATIENT_CLINIC_OR_DEPARTMENT_OTHER)
Admission: EM | Admit: 2024-03-08 | Discharge: 2024-03-08 | Disposition: A | Discharge status: Home / Self Care | Attending: Emergency Medicine | Admitting: Emergency Medicine

## 2024-03-08 DIAGNOSIS — K5732 Diverticulitis of large intestine without perforation or abscess without bleeding: Secondary | ICD-10-CM | POA: Diagnosis not present

## 2024-03-08 DIAGNOSIS — K5792 Diverticulitis of intestine, part unspecified, without perforation or abscess without bleeding: Secondary | ICD-10-CM

## 2024-03-08 DIAGNOSIS — D72829 Elevated white blood cell count, unspecified: Secondary | ICD-10-CM | POA: Insufficient documentation

## 2024-03-08 DIAGNOSIS — R1031 Right lower quadrant pain: Secondary | ICD-10-CM | POA: Diagnosis not present

## 2024-03-08 DIAGNOSIS — R1030 Lower abdominal pain, unspecified: Secondary | ICD-10-CM | POA: Diagnosis not present

## 2024-03-08 DIAGNOSIS — N281 Cyst of kidney, acquired: Secondary | ICD-10-CM | POA: Diagnosis not present

## 2024-03-08 LAB — URINALYSIS, ROUTINE W REFLEX MICROSCOPIC
Bilirubin Urine: NEGATIVE
Glucose, UA: NEGATIVE mg/dL
Hgb urine dipstick: NEGATIVE
Ketones, ur: NEGATIVE mg/dL
Leukocytes,Ua: NEGATIVE
Nitrite: NEGATIVE
Protein, ur: NEGATIVE mg/dL
Specific Gravity, Urine: 1.012 (ref 1.005–1.030)
pH: 5 (ref 5.0–8.0)

## 2024-03-08 LAB — CBC
HCT: 41.6 % (ref 39.0–52.0)
Hemoglobin: 14.3 g/dL (ref 13.0–17.0)
MCH: 31.6 pg (ref 26.0–34.0)
MCHC: 34.4 g/dL (ref 30.0–36.0)
MCV: 91.8 fL (ref 80.0–100.0)
Platelets: 170 K/uL (ref 150–400)
RBC: 4.53 MIL/uL (ref 4.22–5.81)
RDW: 12.2 % (ref 11.5–15.5)
WBC: 14 K/uL — ABNORMAL HIGH (ref 4.0–10.5)
nRBC: 0 % (ref 0.0–0.2)

## 2024-03-08 LAB — COMPREHENSIVE METABOLIC PANEL WITH GFR
ALT: 29 U/L (ref 0–44)
AST: 34 U/L (ref 15–41)
Albumin: 4.5 g/dL (ref 3.5–5.0)
Alkaline Phosphatase: 53 U/L (ref 38–126)
Anion gap: 14 (ref 5–15)
BUN: 15 mg/dL (ref 6–20)
CO2: 24 mmol/L (ref 22–32)
Calcium: 9.7 mg/dL (ref 8.9–10.3)
Chloride: 98 mmol/L (ref 98–111)
Creatinine, Ser: 1.23 mg/dL (ref 0.61–1.24)
GFR, Estimated: >60 mL/min (ref >60)
Glucose, Bld: 185 mg/dL — ABNORMAL HIGH (ref 70–99)
Potassium: 4.1 mmol/L (ref 3.5–5.1)
Sodium: 136 mmol/L (ref 135–145)
Total Bilirubin: 0.8 mg/dL (ref 0.0–1.2)
Total Protein: 7.2 g/dL (ref 6.5–8.1)

## 2024-03-08 LAB — CBG MONITORING, ED: Glucose-Capillary: 148 mg/dL — ABNORMAL HIGH (ref 70–99)

## 2024-03-08 LAB — LIPASE, BLOOD: Lipase: 43 U/L (ref 11–51)

## 2024-03-08 MED ORDER — KETOROLAC TROMETHAMINE 15 MG/ML IJ SOLN
15.0000 mg | Freq: Once | INTRAMUSCULAR | Status: AC
  Administered 2024-03-08: 15 mg via INTRAMUSCULAR
  Filled 2024-03-08: qty 1

## 2024-03-08 MED ORDER — AMOXICILLIN-POT CLAVULANATE 875-125 MG PO TABS
1.0000 | ORAL_TABLET | Freq: Once | ORAL | Status: AC
  Administered 2024-03-08: 1 via ORAL
  Filled 2024-03-08: qty 1

## 2024-03-08 MED ORDER — HYDROCODONE-ACETAMINOPHEN 5-325 MG PO TABS
1.0000 | ORAL_TABLET | Freq: Once | ORAL | Status: AC
  Administered 2024-03-08: 1 via ORAL
  Filled 2024-03-08: qty 1

## 2024-03-08 MED ORDER — AMOXICILLIN-POT CLAVULANATE 875-125 MG PO TABS
1.0000 | ORAL_TABLET | Freq: Two times a day (BID) | ORAL | 0 refills | Status: AC
Start: 2024-03-08 — End: 2024-03-15

## 2024-03-08 MED ORDER — IOHEXOL 300 MG/ML  SOLN
100.0000 mL | Freq: Once | INTRAMUSCULAR | Status: AC | PRN
  Administered 2024-03-08: 100 mL via INTRAVENOUS

## 2024-03-08 NOTE — ED Notes (Signed)
 RN reviewed discharge instructions with pt. Pt verbalized understanding and had no further questions. VSS upon discharge.

## 2024-03-08 NOTE — ED Triage Notes (Signed)
 1000 cramping lower abd. Last BM this AM - normal. Dizziness around 1500 after missing lunch. Denies CP SOB.

## 2024-03-08 NOTE — Discharge Instructions (Addendum)
 Evaluation today revealed that you have diverticulitis.  I am starting you on Augmentin  which is an antibiotic.  Also please follow-up with gastroenterology and schedule an appointment as soon as you can.  If you develop worsening abdominal pain, fever, vomiting, bloody stools or any other concerning symptom please return to the ED for further evaluation.  Also sent a few tablets of Norco to your pharmacy and would also recommend Tylenol  and ibuprofen as well.

## 2024-03-08 NOTE — ED Provider Notes (Addendum)
 [facility] Provider Note   CSN: [id number] Arrival date & time: 03/08/24  1958     Patient presents with: Abdominal Pain  HPI Jaime White is a 53 y.o. male status post appendectomy in 1998 presenting for abdominal pain.  Started this morning around 10 AM.  Pain was in the lower abdomen.  Endorses some nausea but no vomiting.  Normal bowel movement this morning.  Denies urinary symptoms.  States he was somewhat lightheaded around 3:00 after missing his lunch.  His lightheadedness has resolved his abdominal pain at this time is 1 out of 10.  He may have had a rash as a kid with penicillins but has been on amoxicillin  previously.    Abdominal Pain      Prior to Admission medications   Medication Sig Start Date End Date Taking? Authorizing Provider  amoxicillin -clavulanate (AUGMENTIN ) 875-125 MG tablet Take 1 tablet by mouth every 12 (twelve) hours for 7 days. 03/08/24 03/15/24 Yes Latanza Pfefferkorn K, PA-C  LORazepam (ATIVAN) 0.5 MG tablet 1 pill Oral Once a day for 30 days As needed anxiety or sleep 06/20/23   [provider]  ondansetron  (ZOFRAN  ODT) 8 MG disintegrating tablet 8mg  ODT q8 hours prn nausea 07/03/12   Palumbo, April, MD  valACYclovir  (VALTREX ) 1000 MG tablet Take 2 tablets (2,000 mg total) by mouth 2 (two) times daily as needed. 10/04/22       Allergies: Penicillins    Review of Systems  Gastrointestinal:  Positive for abdominal pain.    Updated Vital Signs BP 121/85 (BP Location: Right Arm)   Pulse 84   Temp 100.1 F (37.8 C)   Resp 18   SpO2 98%   Physical Exam Vitals and nursing note reviewed.  HENT:     Head: Normocephalic and atraumatic.     Mouth/Throat:     Mouth: Mucous membranes are moist.  Eyes:     General:        Right eye: No discharge.        Left eye: No discharge.     Conjunctiva/sclera: Conjunctivae normal.  Cardiovascular:     Rate and Rhythm: Normal rate and regular rhythm.      Pulses: Normal pulses.     Heart sounds: Normal heart sounds.  Pulmonary:     Effort: Pulmonary effort is normal.     Breath sounds: Normal breath sounds.  Abdominal:     General: Abdomen is flat.     Palpations: Abdomen is soft.     Tenderness: There is abdominal tenderness in the right lower quadrant, suprapubic area and left lower quadrant.  Skin:    General: Skin is warm and dry.  Neurological:     General: No focal deficit present.  Psychiatric:        Mood and Affect: Mood normal.     (all labs ordered are listed, but only abnormal results are displayed) Labs Reviewed  COMPREHENSIVE METABOLIC PANEL WITH GFR - Abnormal; Notable for the following components:      Result Value   Glucose, Bld 185 (*)    All other components within normal limits  CBC - Abnormal; Notable for the following components:   WBC 14.0 (*)    All other components within normal limits  CBG MONITORING, ED - Abnormal; Notable for the following components:   Glucose-Capillary 148 (*)    All other components within normal limits  LIPASE, BLOOD  URINALYSIS, ROUTINE W REFLEX MICROSCOPIC    EKG:  None  Radiology: CT ABDOMEN PELVIS W CONTRAST Result Date: 03/08/2024 CLINICAL DATA:  Acute abdominal pain.  Lower abdominal cramping. EXAM: CT ABDOMEN AND PELVIS WITH CONTRAST TECHNIQUE: Multidetector CT imaging of the abdomen and pelvis was performed using the standard protocol following bolus administration of intravenous contrast. RADIATION DOSE REDUCTION: This exam was performed according to the departmental dose-optimization program which includes automated exposure control, adjustment of the mA and/or kV according to patient size and/or use of iterative reconstruction technique. CONTRAST:  OMNIPAQUE  IOHEXOL  300 MG/ML  SOLN COMPARISON:  Remote CT 07/03/2012 FINDINGS: Lower chest: Clear lung bases. Hepatobiliary: No focal liver abnormality is seen. No gallstones, gallbladder wall thickening, or biliary  dilatation. Pancreas: No ductal dilatation or inflammation. Spleen: Normal in size without focal abnormality. Adrenals/Urinary Tract: Normal adrenal glands. No hydronephrosis, renal calculi or suspicious renal abnormality. Small right renal cysts. No further follow-up imaging is recommended. Mild bladder distention without wall thickening. Stomach/Bowel: Colonic wall thickening or pericolonic edema involving a short segment of sigmoid colon, series 2, image 65, in the region of multiple diverticula. There are multiple additional noninflamed colonic diverticula. Trace free fluid in the region of inflammation but no organized collection. No convincing free air, small foci of gas in the pelvis felt to be diverticula adjacent to redundant sigmoid rather than extraluminal air. No small bowel distension or evidence of obstruction. No small bowel inflammation. Appendectomy. Unremarkable appearance of the stomach. Vascular/Lymphatic: Normal caliber abdominal aorta. Patent portal vein. No adenopathy. Reproductive: Prostate is unremarkable. Other: Fat stranding and free fluid in the pelvis related to colonic inflammation. No upper abdominal ascites. No abdominal wall hernia. Musculoskeletal: There are no acute or suspicious osseous abnormalities. Stable bone island in the right proximal femur. Chronic anterior wedging of lower thoracic vertebra. IMPRESSION: Acute sigmoid colonic diverticulitis. No abscess. No convincing free-air, small foci of gas in the pelvis felt to be diverticula arising from redundant sigmoid. Close clinical follow-up is recommended. Electronically Signed   By: Andrea Gasman M.D.   On: 03/08/2024 23:00     Procedures   Medications Ordered in the ED  ketorolac  (TORADOL ) 15 MG/ML injection 15 mg (has no administration in time range)  iohexol  (OMNIPAQUE ) 300 MG/ML solution 100 mL (100 mLs Intravenous Contrast Given 03/08/24 2221)  HYDROcodone -acetaminophen  (NORCO/VICODIN) 5-325 MG per tablet 1  tablet (1 tablet Oral Given 03/08/24 2318)  amoxicillin -clavulanate (AUGMENTIN ) 875-125 MG per tablet 1 tablet (1 tablet Oral Given 03/08/24 2318)                                    Medical Decision Making Amount and/or Complexity of Data Reviewed Labs: ordered. Radiology: ordered.  Risk Prescription drug management.   53 year old well-appearing male presenting for abdominal pain.  Exam notable for generalized abdominal tenderness.  CT showed acute sigmoid colonic diverticulitis but no evidence of abscess or free air.  There was some suggestion of the small foci of gas but thought to be arising from the redundant sigmoid.  Shared these findings with the patient.  Labs revealed a slight leukocytosis at 14,000 but vitals are normal and he looks well.  He states his abdominal pain remains at a 1 out of 10 on reassessment.  No vomiting during this encounter.  Fluid challenge with no issue.  Discussed patient with pharmacy and advised to go ahead and start him on Augmentin .  And send a few tablets of Norco to his pharmacy  advised him to follow-up with GI.  Discussed return precautions.  Discharged in good condition.     Final diagnoses:  Diverticulitis    ED Discharge Orders          Ordered    amoxicillin -clavulanate (AUGMENTIN ) 875-125 MG tablet  Every 12 hours        03/08/24 2321              Gonsalo Cuthbertson K, PA-C 03/08/24 2326    Kenzlee Fishburn K, PA-C 03/08/24 2341    Elnor Bernarda SQUIBB, DO 03/21/24 2328

## 2024-03-12 ENCOUNTER — Telehealth: Payer: Self-pay | Admitting: Internal Medicine

## 2024-03-12 NOTE — Telephone Encounter (Signed)
 Dottie, Everything was fine when he had his colonoscopy.  It was noted that he had diverticulosis without diverticulitis. Obviously, 3 weeks later he developed diverticulitis (which can happen, if you have diverticulosis). As you know, his colonoscopy had nothing to do with his subsequent development of diverticulitis. Thanks for the notification, Dr. Abran

## 2024-03-12 NOTE — Telephone Encounter (Signed)
 Patient called and stated that after he had his colonoscopy on the beginning of the month of August he was seen in the ED for a diverticulitis flare up which to him was strange since he was told everything was good when he had a colonoscopy. Patient is requesting to speak to the nurse or Dr. Abran due to him not wanting to wait for an appointment to discuss this matter. Please advise.

## 2024-03-12 NOTE — Telephone Encounter (Signed)
 Spoke to patient who states he had colonoscopy 8/1 and then had what hospital confirmed as diverticulitis on 03/08/24. He states he was given antibiotics (augmentin  BID x 7 days). Has had 4 days of medication at this time and states that his abdominal pain has improved though he still has some cramps at times and night sweats (no fever). Patient is advised to continue his antibiotics through their entirety and to let us  know if his pain returns, does not subside, he develops fever etc. Recommended low fiber/bland diet over the next several days with advancement as tolerated.  Patient wanted to make Dr Abran aware of this complication.

## 2024-03-15 DIAGNOSIS — K5792 Diverticulitis of intestine, part unspecified, without perforation or abscess without bleeding: Secondary | ICD-10-CM | POA: Diagnosis not present
